# Patient Record
Sex: Male | Born: 1995 | Race: White | Hispanic: Yes | Marital: Single | Smoking: Never smoker
Health system: Southern US, Community
[De-identification: ages and names within clinical notes are randomized; demographics above are authoritative.]

## PROBLEM LIST (undated history)

## (undated) DIAGNOSIS — I10 Essential (primary) hypertension: Secondary | ICD-10-CM

---

## 2017-10-15 ENCOUNTER — Encounter (HOSPITAL_COMMUNITY): Payer: Self-pay

## 2017-10-15 ENCOUNTER — Emergency Department (HOSPITAL_COMMUNITY)
Admission: EM | Admit: 2017-10-15 | Discharge: 2017-10-15 | Disposition: A | Discharge status: Home / Self Care | Payer: Self-pay | Attending: Emergency Medicine | Admitting: Emergency Medicine

## 2017-10-15 ENCOUNTER — Other Ambulatory Visit: Payer: Self-pay

## 2017-10-15 ENCOUNTER — Emergency Department (HOSPITAL_COMMUNITY): Payer: Self-pay

## 2017-10-15 DIAGNOSIS — Z23 Encounter for immunization: Secondary | ICD-10-CM | POA: Insufficient documentation

## 2017-10-15 DIAGNOSIS — S81841A Puncture wound with foreign body, right lower leg, initial encounter: Secondary | ICD-10-CM | POA: Insufficient documentation

## 2017-10-15 DIAGNOSIS — M795 Residual foreign body in soft tissue: Secondary | ICD-10-CM

## 2017-10-15 DIAGNOSIS — Y990 Civilian activity done for income or pay: Secondary | ICD-10-CM | POA: Insufficient documentation

## 2017-10-15 DIAGNOSIS — Y9389 Activity, other specified: Secondary | ICD-10-CM | POA: Insufficient documentation

## 2017-10-15 DIAGNOSIS — Y9261 Building [any] under construction as the place of occurrence of the external cause: Secondary | ICD-10-CM | POA: Insufficient documentation

## 2017-10-15 DIAGNOSIS — I10 Essential (primary) hypertension: Secondary | ICD-10-CM | POA: Insufficient documentation

## 2017-10-15 DIAGNOSIS — W294XXA Contact with nail gun, initial encounter: Secondary | ICD-10-CM | POA: Insufficient documentation

## 2017-10-15 HISTORY — DX: Essential (primary) hypertension: I10

## 2017-10-15 MED ORDER — CEPHALEXIN 250 MG PO CAPS
500.0000 mg | ORAL_CAPSULE | Freq: Once | ORAL | Status: AC
  Administered 2017-10-15: 500 mg via ORAL
  Filled 2017-10-15: qty 2

## 2017-10-15 MED ORDER — CEPHALEXIN 500 MG PO CAPS: 500.0000 mg | ORAL_CAPSULE | Freq: Three times a day (TID) | ORAL | 0 refills | Status: AC

## 2017-10-15 MED ORDER — TETANUS-DIPHTH-ACELL PERTUSSIS 5-2.5-18.5 LF-MCG/0.5 IM SUSP
0.5000 mL | Freq: Once | INTRAMUSCULAR | Status: AC
  Administered 2017-10-15: 0.5 mL via INTRAMUSCULAR
  Filled 2017-10-15: qty 0.5

## 2017-10-15 NOTE — ED Triage Notes (Signed)
Using spanish interpreter # [phone]  Pt. Was seen at clinic today after getting a roofing nail stuck in his right inner lower leg from nail gun. Pt was given some "numbing medicine at the clinic" they would not remove due to barbs on the nail.

## 2017-10-15 NOTE — ED Provider Notes (Signed)
[facility] EMERGENCY DEPARTMENT Provider Note   CSN: [id number] Arrival date & time: 10/15/17  1653     History   Chief Complaint Chief Complaint  Patient presents with  . Leg Injury    HPI Antonio Skinner is a 22 y.o. male.  HPI Spanish translator used.  22 year old male presents today with a male puncture wound.  Patient notes that he was working when a roofing nail shot through his right calf.  Patient notes immediate pain.  He went to urgent care who was unable to remove the nail.  Patient does not recall receiving tetanus vaccination.  Past Medical History:  Diagnosis Date  . Hypertension     There are no active problems to display for this patient.   History reviewed. No pertinent surgical history.      Home Medications    Prior to Admission medications   Medication Sig Start Date End Date Taking? Authorizing Provider  cephALEXin (KEFLEX) 500 MG capsule Take 1 capsule (500 mg total) by mouth 3 (three) times daily. 10/15/17   Eyvonne Mechanic, PA-C    Family History No family history on file.  Social History Social History   Tobacco Use  . Smoking status: Never Smoker  . Smokeless tobacco: Never Used  Substance Use Topics  . Alcohol use: Yes    Frequency: Never    Comment: social   . Drug use: Yes    Types: Marijuana     Allergies   Patient has no known allergies.   Review of Systems Review of Systems  All other systems reviewed and are negative.  Physical Exam Updated Vital Signs BP 138/81 (BP Location: Left Arm)   Pulse 70   Temp 99.1 F (37.3 C) (Oral)   Resp 16   Ht 5' 5.75" (1.67 m)   Wt 81.6 kg   SpO2 98%   BMI 29.28 kg/m   Physical Exam  Constitutional: He is oriented to person, place, and time. He appears well-developed and well-nourished.  HENT:  Head: Normocephalic and atraumatic.  Eyes: Pupils are equal, round, and reactive to light. Conjunctivae are normal. Right eye exhibits no  discharge. Left eye exhibits no discharge. No scleral icterus.  Neck: Normal range of motion. No JVD present. No tracheal deviation present.  Pulmonary/Chest: Effort normal. No stridor.  Musculoskeletal:  Nail through the soft tissue along the mid tibia  Neurological: He is alert and oriented to person, place, and time. Coordination normal.  Psychiatric: He has a normal mood and affect. His behavior is normal. Judgment and thought content normal.  Nursing note and vitals reviewed.    ED Treatments / Results  Labs (all labs ordered are listed, but only abnormal results are displayed) Labs Reviewed - No data to display  EKG None  Radiology Dg Tibia/fibula Right  Result Date: 10/15/2017 CLINICAL DATA:  Nail gun injury in the medial right lower leg. EXAM: RIGHT TIBIA AND FIBULA - 2 VIEW COMPARISON:  None. FINDINGS: A metallic nail is demonstrated in the medial soft tissues of the mid right lower leg. No fracture or dislocation is seen. IMPRESSION: No fracture or dislocation. Metallic nail in the medial soft tissues of the mid right lower leg. Electronically Signed   By: Beckie Salts M.D.   On: 10/15/2017 18:46    Procedures .Foreign Body Removal Date/Time: 10/15/2017 8:13 PM Performed by: Eyvonne Mechanic, PA-C Authorized by: Eyvonne Mechanic, PA-C  Consent: Verbal consent obtained. Risks and benefits: risks, benefits and alternatives were  discussed Patient understanding: patient states understanding of the procedure being performed Patient consent: the patient's understanding of the procedure matches consent given Relevant documents: relevant documents present and verified Test results: test results available and properly labeled Imaging studies: imaging studies available Patient identity confirmed: verbally with patient Body area: skin General location: lower extremity Location details: right lower leg Anesthesia: local infiltration  Anesthesia: Local Anesthetic: lidocaine 2%  without epinephrine Anesthetic total: 3 mL Localization method: visualized Dressing: antibiotic ointment and dressing applied Tendon involvement: none Depth: deep Complexity: simple 1 objects recovered. Objects recovered: nail Post-procedure assessment: foreign body removed Patient tolerance: Patient tolerated the procedure well with no immediate complications   (including critical care time)    Medications Ordered in ED Medications  cephALEXin (KEFLEX) capsule 500 mg (has no administration in time range)  Tdap (BOOSTRIX) injection 0.5 mL (has no administration in time range)     Initial Impression / Assessment and Plan / ED Course  I have reviewed the triage vital signs and the nursing notes.  Pertinent labs & imaging results that were available during my care of the patient were reviewed by me and considered in my medical decision making (see chart for details).     Labs:   Imaging: DG Tib/fib  Consults:  Therapeutics: keflex  Discharge Meds: keflex   Assessment/Plan: 22 year old male presents today with a nail in his leg.  No bony involvement 1 of the prongs of the nail was pointing out, very minimal wound extension was made which allowed the prong to be released and easily pulled the nail out.  The nail was removed in its entirety.  Wound was copiously irrigated.  Patient will be discharged on prophylactic antibiotics, strict return precautions given.  He verbalized understanding and agreement to today's plan had no further questions or concerns.      Final Clinical Impressions(s) / ED Diagnoses   Final diagnoses:  Foreign body (FB) in soft tissue    ED Discharge Orders         Ordered    cephALEXin (KEFLEX) 500 MG capsule  3 times daily     10/15/17 2007           Eyvonne Mechanic, Cordelia Poche 10/15/17 2014    Alvira Monday, MD 10/17/17 1339

## 2017-10-15 NOTE — Discharge Instructions (Addendum)
Please read attached information. If you experience any new or worsening signs or symptoms please return to the emergency room for evaluation. Please follow-up with your primary care provider or specialist as discussed. Please use medication prescribed only as directed and discontinue taking if you have any concerning signs or symptoms.   °

## 2018-03-04 ENCOUNTER — Encounter (HOSPITAL_COMMUNITY): Payer: Self-pay

## 2018-03-04 ENCOUNTER — Emergency Department (HOSPITAL_COMMUNITY): Payer: Self-pay

## 2018-03-04 ENCOUNTER — Emergency Department (HOSPITAL_COMMUNITY)
Admission: EM | Admit: 2018-03-04 | Discharge: 2018-03-04 | Disposition: A | Discharge status: Home / Self Care | Payer: Self-pay | Attending: Emergency Medicine | Admitting: Emergency Medicine

## 2018-03-04 DIAGNOSIS — T07XXXA Unspecified multiple injuries, initial encounter: Secondary | ICD-10-CM

## 2018-03-04 DIAGNOSIS — S51011A Laceration without foreign body of right elbow, initial encounter: Secondary | ICD-10-CM | POA: Insufficient documentation

## 2018-03-04 DIAGNOSIS — W25XXXA Contact with sharp glass, initial encounter: Secondary | ICD-10-CM | POA: Insufficient documentation

## 2018-03-04 DIAGNOSIS — F1290 Cannabis use, unspecified, uncomplicated: Secondary | ICD-10-CM | POA: Insufficient documentation

## 2018-03-04 DIAGNOSIS — Y939 Activity, unspecified: Secondary | ICD-10-CM | POA: Insufficient documentation

## 2018-03-04 DIAGNOSIS — Y999 Unspecified external cause status: Secondary | ICD-10-CM | POA: Insufficient documentation

## 2018-03-04 DIAGNOSIS — Y929 Unspecified place or not applicable: Secondary | ICD-10-CM | POA: Insufficient documentation

## 2018-03-04 MED ORDER — HYDROCODONE-ACETAMINOPHEN 5-325 MG PO TABS
1.0000 | ORAL_TABLET | Freq: Once | ORAL | Status: AC
  Administered 2018-03-04: 1 via ORAL
  Filled 2018-03-04: qty 1

## 2018-03-04 MED ORDER — LIDOCAINE-EPINEPHRINE (PF) 2 %-1:200000 IJ SOLN
20.0000 mL | Freq: Once | INTRAMUSCULAR | Status: AC
  Administered 2018-03-04: 20 mL
  Filled 2018-03-04: qty 20

## 2018-03-04 MED ORDER — HYDROCODONE-ACETAMINOPHEN 5-325 MG PO TABS: 1.0000 | ORAL_TABLET | Freq: Four times a day (QID) | ORAL | 0 refills | Status: AC | PRN

## 2018-03-04 MED ORDER — ONDANSETRON HCL 4 MG/2ML IJ SOLN
4.0000 mg | Freq: Once | INTRAMUSCULAR | Status: AC
  Administered 2018-03-04: 4 mg via INTRAVENOUS
  Filled 2018-03-04: qty 2

## 2018-03-04 MED ORDER — MORPHINE SULFATE (PF) 4 MG/ML IV SOLN
4.0000 mg | Freq: Once | INTRAVENOUS | Status: AC
  Administered 2018-03-04: 4 mg via INTRAVENOUS
  Filled 2018-03-04: qty 1

## 2018-03-04 NOTE — ED Notes (Signed)
ED Provider at bedside. 

## 2018-03-04 NOTE — ED Provider Notes (Signed)
[facility] Provider Note   CSN: [id number] Arrival date & time: 03/04/18  2008     History   Chief Complaint Chief Complaint  Patient presents with  . Laceration    HPI Antonio Skinner is a 23 y.o. male.  The history is provided by the patient and medical records. The history is limited by a language barrier. A language interpreter was used.  Laceration  Associated symptoms: no fever      23 year old Hispanic male presenting to the ED accompanied by interpreter and family members for evaluation of a recent injury.  History obtained through language interpreter at bedside.  Patient was brought here via EMS from home.  Patient admits that he was consuming alcohol and states that he was upset, he subsequently punched his right dominant arm through the glass on a door and injured his right elbow.  EMS report a large laceration to the medial aspect of his right elbow actively bleeding.  Dressing was applied and patient was brought here.  Patient report initially pain was 10 out of 10 and with rest, pain is currently 5 out of 10, sharp, throbbing, nonradiating.  No associated numbness or weakness.  He is up-to-date with tetanus.  He denies any other injury.  Past Medical History:  Diagnosis Date  . Hypertension     There are no active problems to display for this patient.   History reviewed. No pertinent surgical history.      Home Medications    Prior to Admission medications   Medication Sig Start Date End Date Taking? Authorizing Provider  cephALEXin (KEFLEX) 500 MG capsule Take 1 capsule (500 mg total) by mouth 3 (three) times daily. 10/15/17   Eyvonne MechanicHedges, Jeffrey, PA-C    Family History History reviewed. No pertinent family history.  Social History Social History   Tobacco Use  . Smoking status: Never Smoker  . Smokeless tobacco: Never Used  Substance Use Topics  . Alcohol use: Yes    Frequency: Never    Comment:  social   . Drug use: Yes    Types: Marijuana     Allergies   Patient has no known allergies.   Review of Systems Review of Systems  Constitutional: Negative for fever.  Skin: Positive for wound.  Neurological: Negative for numbness.     Physical Exam Updated Vital Signs BP 118/83 (BP Location: Left Arm)   Pulse 95   Temp 99.1 F (37.3 C) (Oral)   Resp 18   SpO2 96%   Physical Exam Vitals signs and nursing note reviewed.  Constitutional:      General: He is not in acute distress.    Appearance: He is well-developed.  HENT:     Head: Atraumatic.  Eyes:     Conjunctiva/sclera: Conjunctivae normal.  Neck:     Musculoskeletal: Neck supple.  Musculoskeletal:        General: Signs of injury (R elbow: multiple superficial and one deep oblique lacerations noted to medial elbow on the volar surface.  deep lac measuring 4cm, next superficial lac 3cm. no fb, no joint involvement) present.  Skin:    Findings: No rash.  Neurological:     Mental Status: He is alert.      ED Treatments / Results  Labs (all labs ordered are listed, but only abnormal results are displayed) Labs Reviewed - No data to display  EKG None  Radiology Dg Elbow Complete Right  Result Date: 03/04/2018 CLINICAL DATA:  Punched are through glass door with lacerations, initial encounter EXAM: RIGHT ELBOW - COMPLETE 3+ VIEW COMPARISON:  None. FINDINGS: Soft tissue lacerations are identified. Overlying dressing material is seen. No acute bony abnormality is seen. No definitive radiopaque foreign body is noted. IMPRESSION: Soft tissue injury without acute bony abnormality or retained foreign body. Electronically Signed   By: Alcide Clever M.D.   On: 03/04/2018 21:24    Procedures .Marland KitchenLaceration Repair Date/Time: 03/04/2018 10:40 PM Performed by: Fayrene Helper, PA-C Authorized by: Fayrene Helper, PA-C   Consent:    Consent obtained:  Verbal   Consent given by:  Patient   Risks discussed:  Infection, need  for additional repair, pain, poor cosmetic result and poor wound healing   Alternatives discussed:  No treatment and delayed treatment Universal protocol:    Procedure explained and questions answered to patient or proxy's satisfaction: yes     Relevant documents present and verified: yes     Test results available and properly labeled: yes     Imaging studies available: yes     Required blood products, implants, devices, and special equipment available: yes     Site/side marked: yes     Immediately prior to procedure, a time out was called: yes     Patient identity confirmed:  Verbally with patient Anesthesia (see MAR for exact dosages):    Anesthesia method:  Local infiltration   Local anesthetic:  Lidocaine 2% WITH epi Laceration details:    Location:  Shoulder/arm   Shoulder/arm location:  R elbow   Length (cm):  4   Depth (mm):  12 Repair type:    Repair type:  Complex Pre-procedure details:    Preparation:  Patient was prepped and draped in usual sterile fashion and imaging obtained to evaluate for foreign bodies Exploration:    Limited defect created (wound extended): yes     Hemostasis achieved with:  Tourniquet, tied off vessels, epinephrine and direct pressure   Wound exploration: wound explored through full range of motion and entire depth of wound probed and visualized     Wound extent: areolar tissue violated and vascular damage     Wound extent: no foreign bodies/material noted, no muscle damage noted, no nerve damage noted, no tendon damage noted and no underlying fracture noted     Contaminated: no   Treatment:    Area cleansed with:  Saline and Betadine   Amount of cleaning:  Extensive   Irrigation solution:  Sterile saline   Irrigation method:  Pressure wash   Visualized foreign bodies/material removed: no     Debridement:  Moderate   Undermining:  Extensive   Scar revision: no   Skin repair:    Repair method:  Sutures   Suture size:  4-0   Suture material:   Prolene (additional chromic sutures were used to tied off bleeding artery)   Suture technique:  Simple interrupted   Number of sutures:  7 Approximation:    Approximation:  Close Post-procedure details:    Dressing:  Sterile dressing   Patient tolerance of procedure:  Tolerated well, no immediate complications .Marland KitchenLaceration Repair Date/Time: 03/04/2018 10:44 PM Performed by: Fayrene Helper, PA-C Authorized by: Fayrene Helper, PA-C   Consent:    Consent obtained:  Verbal   Consent given by:  Patient   Risks discussed:  Infection, need for additional repair, pain, poor cosmetic result and poor wound healing   Alternatives discussed:  No treatment and delayed treatment Universal protocol:    Procedure  explained and questions answered to patient or proxy's satisfaction: yes     Relevant documents present and verified: yes     Test results available and properly labeled: yes     Imaging studies available: yes     Required blood products, implants, devices, and special equipment available: yes     Site/side marked: yes     Immediately prior to procedure, a time out was called: yes     Patient identity confirmed:  Verbally with patient Anesthesia (see MAR for exact dosages):    Anesthesia method:  Local infiltration   Local anesthetic:  Lidocaine 2% WITH epi Laceration details:    Location:  Shoulder/arm   Shoulder/arm location:  R elbow   Length (cm):  3   Depth (mm):  4 Repair type:    Repair type:  Intermediate Pre-procedure details:    Preparation:  Patient was prepped and draped in usual sterile fashion and imaging obtained to evaluate for foreign bodies Exploration:    Hemostasis achieved with:  Epinephrine and direct pressure   Wound exploration: wound explored through full range of motion and entire depth of wound probed and visualized     Wound extent: no muscle damage noted, no underlying fracture noted and no vascular damage noted     Contaminated: no   Treatment:    Area  cleansed with:  Betadine and saline   Amount of cleaning:  Standard   Irrigation solution:  Sterile saline Skin repair:    Repair method:  Sutures   Suture size:  5-0   Suture material:  Prolene   Suture technique:  Simple interrupted   Number of sutures:  4 Approximation:    Approximation:  Close Post-procedure details:    Dressing:  Sterile dressing   Patient tolerance of procedure:  Tolerated well, no immediate complications   (including critical care time)  Medications Ordered in ED Medications  lidocaine-EPINEPHrine (XYLOCAINE W/EPI) 2 %-1:200000 (PF) injection 20 mL (20 mLs Infiltration Given by Other 03/04/18 2108)  morphine 4 MG/ML injection 4 mg (4 mg Intravenous Given 03/04/18 2154)  ondansetron (ZOFRAN) injection 4 mg (4 mg Intravenous Given 03/04/18 2154)     Initial Impression / Assessment and Plan / ED Course  I have reviewed the triage vital signs and the nursing notes.  Pertinent labs & imaging results that were available during my care of the patient were reviewed by me and considered in my medical decision making (see chart for details).     BP 116/72   Pulse 98   Temp 99.1 F (37.3 C) (Oral)   Resp 18   SpO2 95%    Final Clinical Impressions(s) / ED Diagnoses   Final diagnoses:  Laceration of right elbow with complication, initial encounter  Multiple lacerations    ED Discharge Orders         Ordered    HYDROcodone-acetaminophen (NORCO/VICODIN) 5-325 MG tablet  Every 6 hours PRN     03/04/18 2307         9:14 PM Patient was upset, punched a glass window and suffered a deep laceration involving the medial aspect of his right elbow.  He is neurovascular intact and able to move all 5 fingers without any movement restriction.  He is up-to-date with tetanus.  Will obtain x-ray to rule out foreign body.  10:37 PM Pt has multiple superficial lacs and one deep laceration to volar aspect of R elbow without fb or joint involvement.  He is NVI.  There  is an arterial bleed from deep lac requiring surgical repaired by DR. Isaacs.  The rest of the lacerations were repaired by me. Pt tolerates well.   11:00 PM Wound care instruction was given via language interpreter.  Pt receiving pain medication. Recommend sutures removal in 7 days.  Return precaution given.    Fayrene Helperran, Izic Stfort, PA-C 03/04/18 2310    Shaune PollackIsaacs, Cameron, MD 03/05/18 1304

## 2018-03-04 NOTE — ED Triage Notes (Addendum)
Pt arrives from home via [facility]. Pt reports having 10 or more beers over past 6 hours. Pt punched right arm through glass. Per EMS, lacerations present to right AC. Bleeding controlled upon EMS arrival. Dressing clean, dry and intact upon arrival. Pt endorses last tetanus shot 4 months ago. Pt denies any other injuries. Pt alert and oriented. Peripheral pulses intact. Pt able to move all fingers.

## 2018-03-04 NOTE — ED Notes (Signed)
Tactical tourniquet removed at this time per EDP,at bedside if needed.

## 2018-03-04 NOTE — ED Notes (Signed)
RN discovered bleeding not controlled. EDP notified. Tactical tourniquet applied per EDP.

## 2018-03-04 NOTE — Discharge Instructions (Signed)
Take vicodin as needed for pain.  Check wound daily for signs of infection.  Do not submerge wound in water for any prolonged duration.  Have sutures remove in 7 days at Urgent Care center.  Return if you have any concerns.

## 2018-08-29 ENCOUNTER — Encounter (HOSPITAL_COMMUNITY): Payer: Self-pay | Admitting: Emergency Medicine

## 2018-08-29 ENCOUNTER — Emergency Department (HOSPITAL_COMMUNITY): Payer: Self-pay

## 2018-08-29 ENCOUNTER — Other Ambulatory Visit: Payer: Self-pay

## 2018-08-29 ENCOUNTER — Emergency Department (HOSPITAL_COMMUNITY)
Admission: EM | Admit: 2018-08-29 | Discharge: 2018-08-29 | Disposition: A | Discharge status: Home / Self Care | Payer: Self-pay | Attending: Emergency Medicine | Admitting: Emergency Medicine

## 2018-08-29 DIAGNOSIS — R0789 Other chest pain: Secondary | ICD-10-CM | POA: Insufficient documentation

## 2018-08-29 DIAGNOSIS — R002 Palpitations: Secondary | ICD-10-CM | POA: Insufficient documentation

## 2018-08-29 DIAGNOSIS — J020 Streptococcal pharyngitis: Secondary | ICD-10-CM | POA: Insufficient documentation

## 2018-08-29 DIAGNOSIS — Z20828 Contact with and (suspected) exposure to other viral communicable diseases: Secondary | ICD-10-CM | POA: Insufficient documentation

## 2018-08-29 DIAGNOSIS — R079 Chest pain, unspecified: Secondary | ICD-10-CM

## 2018-08-29 DIAGNOSIS — I10 Essential (primary) hypertension: Secondary | ICD-10-CM | POA: Insufficient documentation

## 2018-08-29 DIAGNOSIS — Z79899 Other long term (current) drug therapy: Secondary | ICD-10-CM | POA: Insufficient documentation

## 2018-08-29 LAB — COMPREHENSIVE METABOLIC PANEL
ALT: 94 U/L — ABNORMAL HIGH (ref 0–44)
AST: 107 U/L — ABNORMAL HIGH (ref 15–41)
Albumin: 4.5 g/dL (ref 3.5–5.0)
Alkaline Phosphatase: 71 U/L (ref 38–126)
Anion gap: 15 (ref 5–15)
BUN: 6 mg/dL (ref 6–20)
CO2: 23 mmol/L (ref 22–32)
Calcium: 9.3 mg/dL (ref 8.9–10.3)
Chloride: 97 mmol/L — ABNORMAL LOW (ref 98–111)
Creatinine, Ser: 0.51 mg/dL — ABNORMAL LOW (ref 0.61–1.24)
GFR calc Af Amer: >60 mL/min (ref >60)
GFR calc non Af Amer: >60 mL/min (ref >60)
Glucose, Bld: 109 mg/dL — ABNORMAL HIGH (ref 70–99)
Potassium: 3.9 mmol/L (ref 3.5–5.1)
Sodium: 135 mmol/L (ref 135–145)
Total Bilirubin: 0.6 mg/dL (ref 0.3–1.2)
Total Protein: 7.8 g/dL (ref 6.5–8.1)

## 2018-08-29 LAB — CBC
HCT: 41.7 % (ref 39.0–52.0)
Hemoglobin: 14.3 g/dL (ref 13.0–17.0)
MCH: 30.4 pg (ref 26.0–34.0)
MCHC: 34.3 g/dL (ref 30.0–36.0)
MCV: 88.7 fL (ref 80.0–100.0)
Platelets: 285 10*3/uL (ref 150–400)
RBC: 4.7 MIL/uL (ref 4.22–5.81)
RDW: 12.9 % (ref 11.5–15.5)
WBC: 4.3 10*3/uL (ref 4.0–10.5)
nRBC: 0 % (ref 0.0–0.2)

## 2018-08-29 LAB — RAPID URINE DRUG SCREEN, HOSP PERFORMED
Amphetamines: NOT DETECTED
Barbiturates: NOT DETECTED
Benzodiazepines: NOT DETECTED
Cocaine: NOT DETECTED
Opiates: NOT DETECTED
Tetrahydrocannabinol: NOT DETECTED

## 2018-08-29 LAB — TROPONIN I (HIGH SENSITIVITY)
Troponin I (High Sensitivity): 5 ng/L (ref <18)
Troponin I (High Sensitivity): 5 ng/L (ref <18)

## 2018-08-29 LAB — ETHANOL: Alcohol, Ethyl (B): <10 mg/dL (ref <10)

## 2018-08-29 LAB — GROUP A STREP BY PCR: Group A Strep by PCR: DETECTED — AB

## 2018-08-29 MED ORDER — SODIUM CHLORIDE 0.9 % IV BOLUS
1000.0000 mL | Freq: Once | INTRAVENOUS | Status: AC
  Administered 2018-08-29: 1000 mL via INTRAVENOUS

## 2018-08-29 MED ORDER — IBUPROFEN 800 MG PO TABS: 800.0000 mg | ORAL_TABLET | Freq: Three times a day (TID) | ORAL | 0 refills | Status: AC | PRN

## 2018-08-29 MED ORDER — PENICILLIN G BENZATHINE 1200000 UNIT/2ML IM SUSP
1.2000 10*6.[IU] | Freq: Once | INTRAMUSCULAR | Status: AC
  Administered 2018-08-29: 1.2 10*6.[IU] via INTRAMUSCULAR
  Filled 2018-08-29: qty 2

## 2018-08-29 NOTE — ED Notes (Addendum)
Pt here for eval of chest pain and anxiety. PT drinks 20 beers per day and tequila. Pt shaky.  Pt also endorses some shortness of breath, feels like his throat is tight. Symptoms started this morning. States he had a similar episode like this 2 years ago with the chest pain, it feels the same.

## 2018-08-29 NOTE — Discharge Instructions (Signed)
It was my pleasure taking care of you today!   You tested positive for strep throat. You were treated with antibiotics in the ER today.  Ibuprofen as needed for pain / fever.   Follow up with your primary care doctor.   Return to ER for new or worsening symptoms, any additional concerns.

## 2018-08-29 NOTE — ED Notes (Signed)
Patient verbalizes understanding of discharge instructions. Opportunity for questioning and answers were provided. Armband removed by staff, pt discharged from ED.  

## 2018-08-29 NOTE — ED Notes (Signed)
Patient transported to x-ray. ?

## 2018-08-29 NOTE — ED Provider Notes (Signed)
[facility] EMERGENCY DEPARTMENT Provider Note   CSN: [id number] Arrival date & time: 08/29/18  9509    History   Chief Complaint Chief Complaint  Patient presents with  . Chest Pain    HPI Antonio Skinner Antonio Skinner is a 23 y.o. male.     The history is provided by medical records. A language interpreter was used Oceanographer).  Chest Pain Associated symptoms: palpitations    Antonio Skinner Antonio Skinner is a 23 y.o. male  with a PMH of HTN who presents to the Emergency Department complaining of chest pain which began this morning.  Central.  No shortness of breath.  No alleviating or aggravating factors.  History of similar about a year ago which resolved without any intervention.  He never sought care at that time.  Associated with palpitations when lying down.  He also feels as if he is much more anxious today. Does drink daily. Last drink last night. Does not feel like he is in withdrawal as this is a usual timeframe for him to be sober. No cough, congestion or fever. No sore throat, but feels like there is something in his throat - feels swollen. Denies coronavirus known exposure, but would like to be tested.  He tells me that his pain has now resolved, but he just overall feels anxious and as if something may be wrong, which is what prompted him to come to the emergency department today.  He does have history of hypertension on medication which he has been taking regularly, but has not taken this morning.  No history of hyperlipidemia, diabetes or heart disease.  Denies family cardiac history.  No history of DVT/PE.  No lower extremity pain or swelling.  No recent surgery/immobilization/travel.    Past Medical History:  Diagnosis Date  . Hypertension     There are no active problems to display for this patient.   History reviewed. No pertinent surgical history.      Home Medications    Prior to Admission medications   Medication Sig  Start Date End Date Taking? Authorizing Provider  cephALEXin (KEFLEX) 500 MG capsule Take 1 capsule (500 mg total) by mouth 3 (three) times daily. Patient not taking: Reported on 08/29/2018 10/15/17   Okey Regal, PA-C  HYDROcodone-acetaminophen (NORCO/VICODIN) 5-325 MG tablet Take 1 tablet by mouth every 6 (six) hours as needed for moderate pain. Patient not taking: Reported on 08/29/2018 03/04/18   Domenic Moras, PA-C  ibuprofen (ADVIL) 800 MG tablet Take 1 tablet (800 mg total) by mouth every 8 (eight) hours as needed for fever, mild pain or moderate pain. 08/29/18   Ozias Dicenzo, Ozella Almond, PA-C    Family History No family history on file.  Social History Social History   Tobacco Use  . Smoking status: Never Smoker  . Smokeless tobacco: Never Used  Substance Use Topics  . Alcohol use: Yes    Frequency: Never    Comment: social   . Drug use: Yes    Types: Marijuana     Allergies   Patient has no known allergies.   Review of Systems Review of Systems  Cardiovascular: Positive for chest pain and palpitations. Negative for leg swelling.  All other systems reviewed and are negative.    Physical Exam Updated Vital Signs BP (!) 168/103   Pulse 98   Temp 99.1 F (37.3 C) (Oral)   Resp (!) 24   SpO2 98%   Physical Exam Vitals signs and nursing note  reviewed.  Constitutional:      General: He is not in acute distress.    Appearance: He is well-developed.     Comments: Non-toxic appearing.  HENT:     Head: Normocephalic and atraumatic.     Mouth/Throat:     Comments: OP with erythema. No exudates or tonsillar hypertrophy. Neck:     Musculoskeletal: Neck supple.  Cardiovascular:     Heart sounds: Normal heart sounds. No murmur.     Comments: Regular rate and rhythm. Pulmonary:     Effort: Pulmonary effort is normal. No respiratory distress.     Breath sounds: Normal breath sounds.     Comments: Lungs clear to auscultation bilaterally. Abdominal:     General: There is  no distension.     Palpations: Abdomen is soft.     Tenderness: There is no abdominal tenderness.     Comments: No abdominal tenderness.  Skin:    General: Skin is warm and dry.  Neurological:     Mental Status: He is alert and oriented to person, place, and time.     Comments: No tremors.      ED Treatments / Results  Labs (all labs ordered are listed, but only abnormal results are displayed) Labs Reviewed  GROUP A STREP BY PCR - Abnormal; Notable for the following components:      Result Value   Group A Strep by PCR DETECTED (*)    All other components within normal limits  COMPREHENSIVE METABOLIC PANEL - Abnormal; Notable for the following components:   Chloride 97 (*)    Glucose, Bld 109 (*)    Creatinine, Ser 0.51 (*)    AST 107 (*)    ALT 94 (*)    All other components within normal limits  NOVEL CORONAVIRUS, NAA (HOSPITAL ORDER, SEND-OUT TO REF LAB)  ETHANOL  CBC  RAPID URINE DRUG SCREEN, HOSP PERFORMED  TROPONIN I (HIGH SENSITIVITY)  TROPONIN I (HIGH SENSITIVITY)    EKG EKG Interpretation  Date/Time:  Sunday August 29 2018 07:37:39 EDT Ventricular Rate:  90 PR Interval:  144 QRS Duration: 90 QT Interval:  364 QTC Calculation: 445 R Axis:   65 Text Interpretation:  Normal sinus rhythm Normal ECG No previous tracing Confirmed by Gwyneth SproutPlunkett, Whitney ([id number]) on 08/29/2018 8:24:18 AM   Radiology Dg Chest 2 View  Result Date: 08/29/2018 CLINICAL DATA:  Chest pain EXAM: CHEST - 2 VIEW COMPARISON:  None. FINDINGS: Normal heart size. Normal mediastinal contour. No pneumothorax. No pleural effusion. No pulmonary edema. No acute consolidative airspace disease. Tiny dense nodule in right mid lung is presumably a granuloma. IMPRESSION: No active cardiopulmonary disease. Electronically Signed   By: Delbert PhenixJason A Poff M.D.   On: 08/29/2018 09:41    Procedures Procedures (including critical care time)  Medications Ordered in ED Medications  penicillin g benzathine (BICILLIN  LA) 1200000 UNIT/2ML injection 1.2 Million Units (has no administration in time range)  sodium chloride 0.9 % bolus 1,000 mL (1,000 mLs Intravenous New Bag/Given 08/29/18 0955)     Initial Impression / Assessment and Plan / ED Course  I have reviewed the triage vital signs and the nursing notes.  Pertinent labs & imaging results that were available during my care of the patient were reviewed by me and considered in my medical decision making (see chart for details).       Angela CoxJorge Luis Adelene Idlerllende Martinez is a 23 y.o. male who presents to ED for central chest pain which began earlier this morning.  Now has resolved. Associated with palpitations when lying flat this morning as well. I laid him flat in ED today with no return of palpitations. On exam, he is afebrile, hemodynamically with normal cardiopulmonary examination. Labs reviewed. Hx of significant ETOH use with elevated AST/ALT at 107/94 respectively. He has no abdominal tenderness. Likely 2/2 ETOH. No emergent work up needed in ED today, but should follow up with PCP for such. Negative troponin x2. EKG non-ischemic. CXR without acute findings - no PNA, PNX. Low risk  heart score of 1. PERC negative. Patient's symptoms unlikely to be of cardiac etiology. Covid test sent. Patient actually denies sore throat, but states that he feels his throat is swollen. OP with erythema. Strep PCR + - will treat will Bicillin in ED today. Labs and imaging reviewed again prior to discharge. Return precautions discussed. Evaluation does not show pathology that would require ongoing emergent intervention or inpatient treatment. Encouraged to follow up with PCP. Patient understands return precautions and follow up plan. All questions answered.   Final Clinical Impressions(s) / ED Diagnoses   Final diagnoses:  Chest pain  Strep pharyngitis    ED Discharge Orders         Ordered    ibuprofen (ADVIL) 800 MG tablet  Every 8 hours PRN     08/29/18 1027            Diontre Harps, Chase PicketJaime Pilcher, PA-C 08/29/18 1028    Gwyneth SproutPlunkett, Whitney, MD 08/29/18 2104

## 2018-08-30 LAB — NOVEL CORONAVIRUS, NAA (HOSP ORDER, SEND-OUT TO REF LAB; TAT 18-24 HRS): SARS-CoV-2, NAA: NOT DETECTED

## 2020-05-26 IMAGING — CR CHEST - 2 VIEW
2 series · 2 of 2 positions shown · non-contrast
Comparison: None.

CLINICAL DATA: Chest pain

EXAM:
CHEST - 2 VIEW

[chest pa]
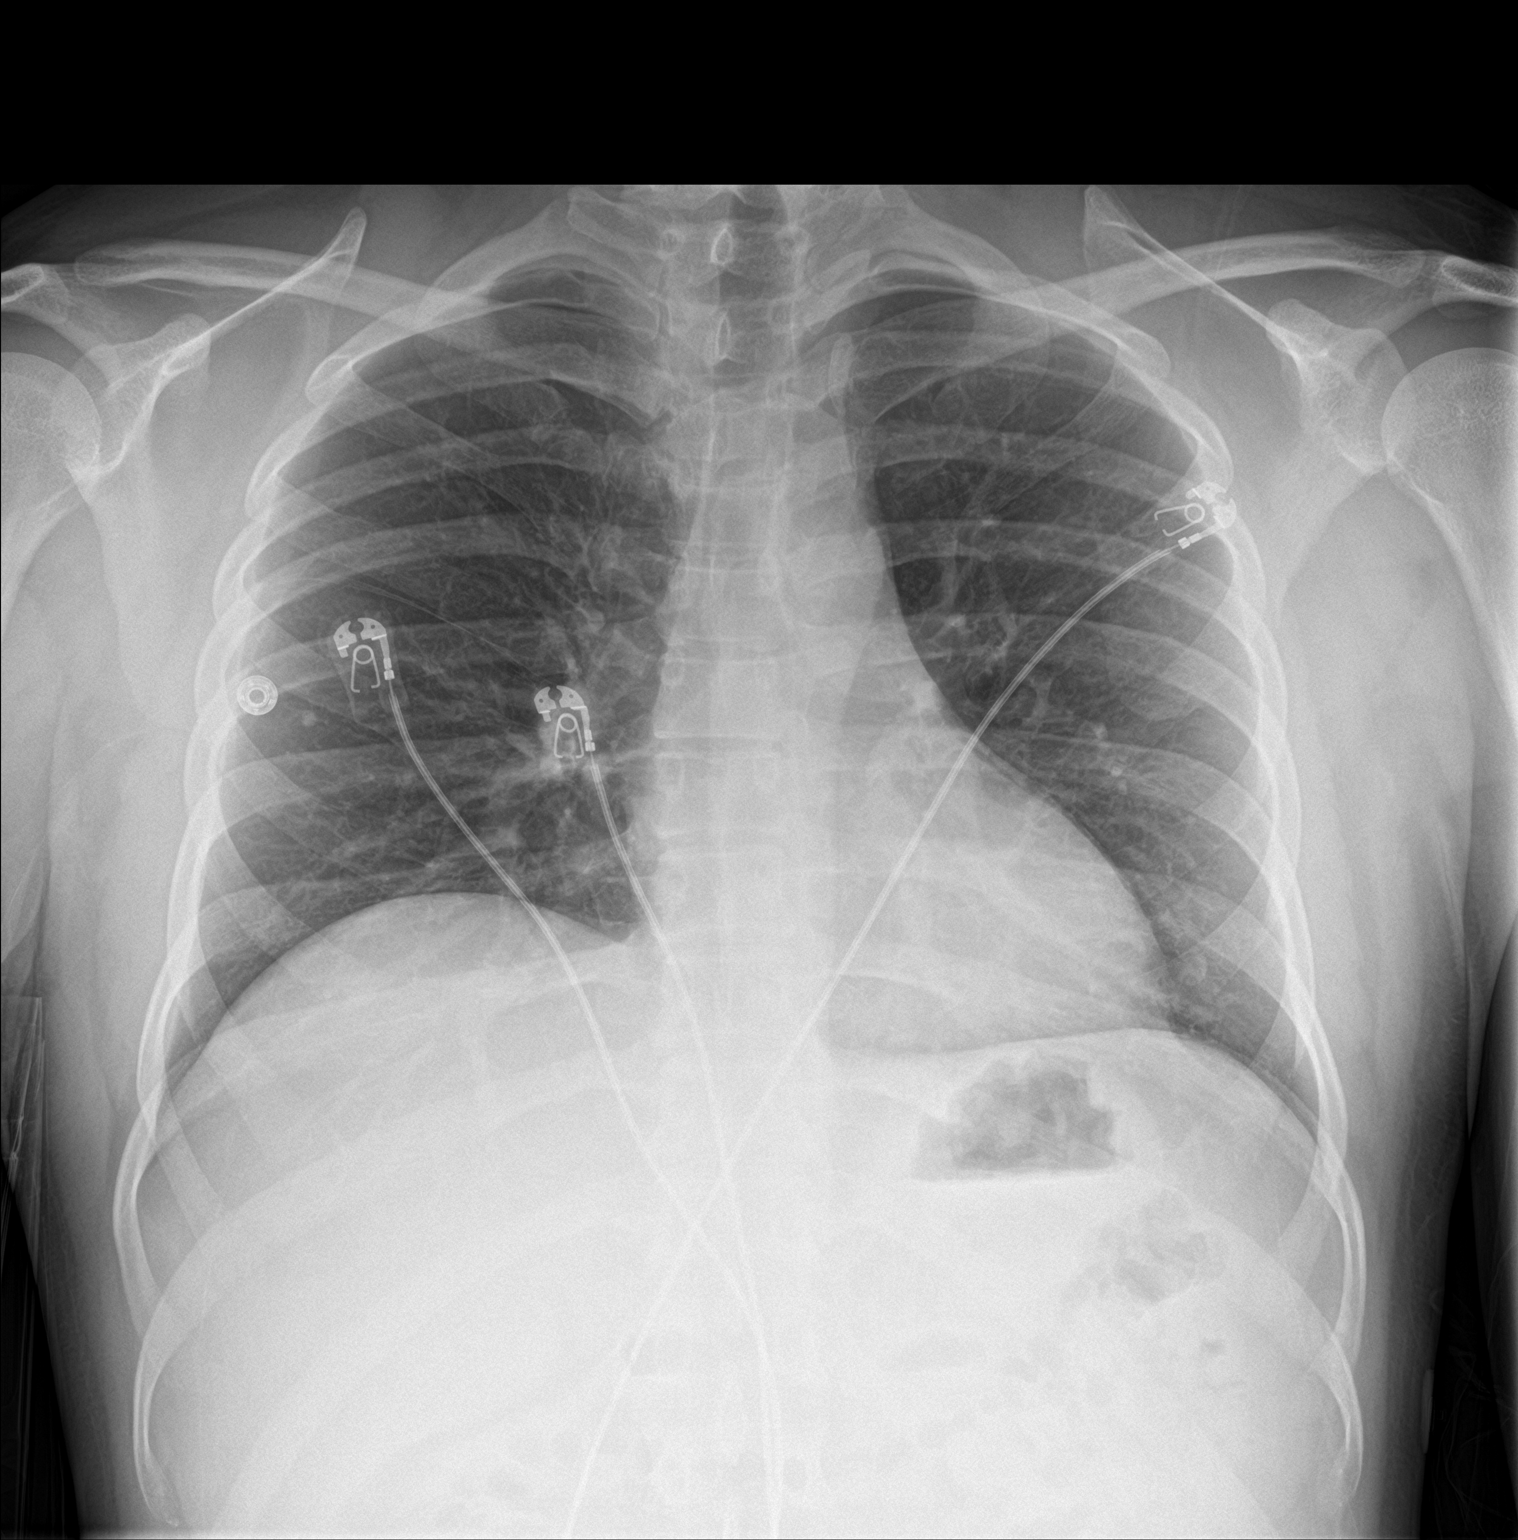

[chest lat]
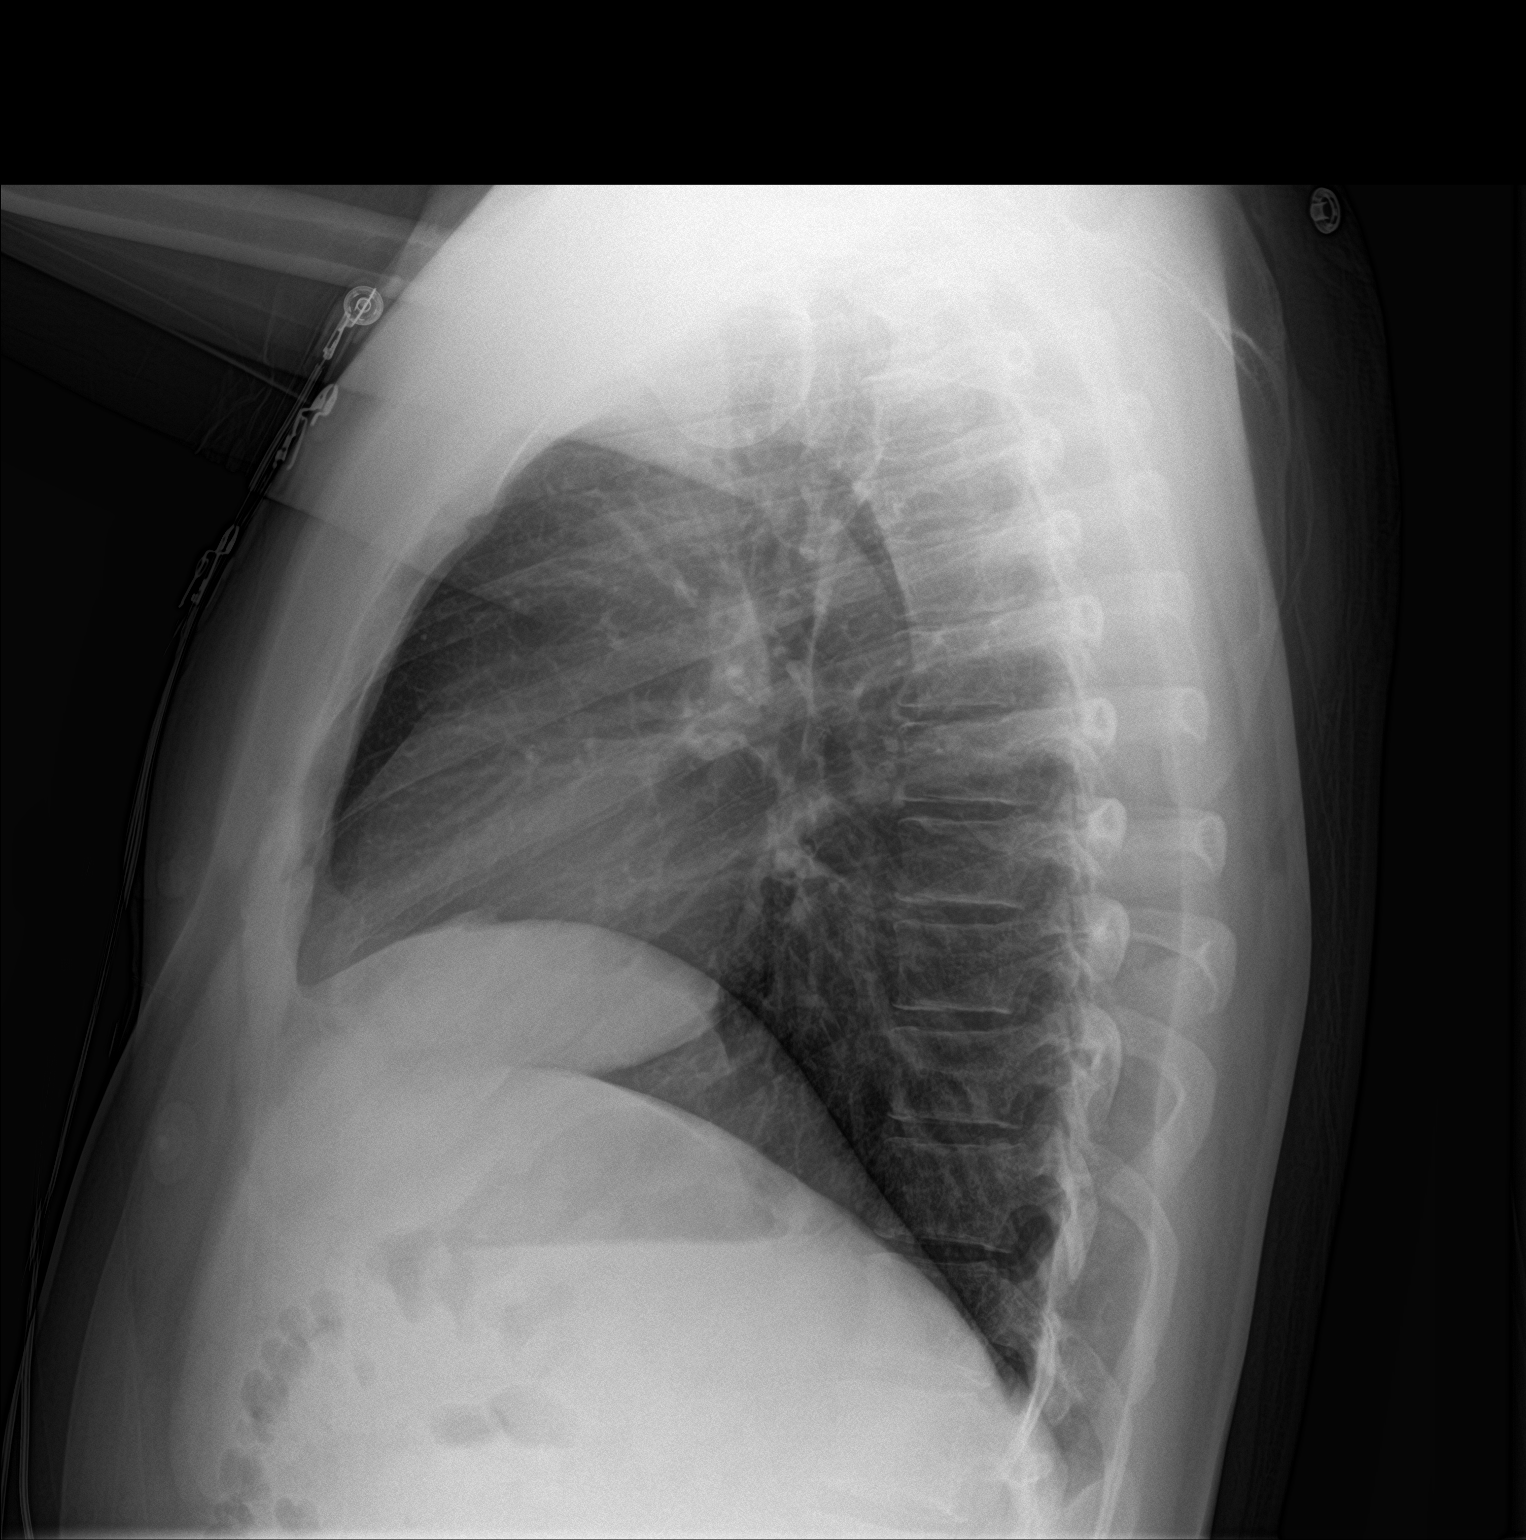

[2 of 2 positions shown; findings below may reference images not displayed]

FINDINGS: Normal heart size. Normal mediastinal contour. No pneumothorax. No
pleural effusion. No pulmonary edema. No acute consolidative
airspace disease. Tiny dense nodule in right mid lung is presumably
a granuloma.
IMPRESSION: No active cardiopulmonary disease.
